# Patient Record
Sex: Female | Born: 1960 | Race: White | Hispanic: No | Marital: Married | Smoking: Never smoker
Health system: Southern US, Community
[De-identification: ages and names within clinical notes are randomized; demographics above are authoritative.]

## PROBLEM LIST (undated history)

## (undated) DIAGNOSIS — K219 Gastro-esophageal reflux disease without esophagitis: Secondary | ICD-10-CM

## (undated) DIAGNOSIS — F329 Major depressive disorder, single episode, unspecified: Secondary | ICD-10-CM

## (undated) DIAGNOSIS — F32A Depression, unspecified: Secondary | ICD-10-CM

## (undated) HISTORY — PX: CARPAL TUNNEL RELEASE: SHX101

---

## 2005-02-24 ENCOUNTER — Emergency Department (HOSPITAL_COMMUNITY): Admission: EM | Admit: 2005-02-24 | Discharge: 2005-02-24 | Payer: Self-pay | Admitting: Family Medicine

## 2011-07-24 ENCOUNTER — Emergency Department (HOSPITAL_COMMUNITY)
Admission: EM | Admit: 2011-07-24 | Discharge: 2011-07-24 | Disposition: A | Discharge status: Home / Self Care | Payer: Self-pay | Source: Home / Self Care

## 2011-07-24 ENCOUNTER — Encounter (HOSPITAL_COMMUNITY): Payer: Self-pay | Admitting: Emergency Medicine

## 2011-07-24 DIAGNOSIS — J329 Chronic sinusitis, unspecified: Secondary | ICD-10-CM

## 2011-07-24 DIAGNOSIS — B9789 Other viral agents as the cause of diseases classified elsewhere: Secondary | ICD-10-CM

## 2011-07-24 HISTORY — DX: Depression, unspecified: F32.A

## 2011-07-24 HISTORY — DX: Gastro-esophageal reflux disease without esophagitis: K21.9

## 2011-07-24 HISTORY — DX: Major depressive disorder, single episode, unspecified: F32.9

## 2011-07-24 MED ORDER — AMOXICILLIN 500 MG PO CAPS: 1000.0000 mg | ORAL_CAPSULE | Freq: Three times a day (TID) | ORAL | Status: AC

## 2011-07-24 MED ORDER — HYDROCOD POLST-CHLORPHEN POLST 10-8 MG/5ML PO LQCR: 5.0000 mL | Freq: Every evening | ORAL | Status: AC | PRN

## 2011-07-24 NOTE — ED Provider Notes (Signed)
History     CSN: [id number]  Arrival date & time 07/24/11  1649   None     Chief Complaint  Patient presents with  . Sore Throat    (Consider location/radiation/quality/duration/timing/severity/associated sxs/prior treatment) HPI Comments: Patient reports sinus pressure and congestion, sore throat, ear pain, and cough x 5 days.  States the pain in her sinuses and the sore throat are the worst part but she is also having trouble sleeping at night because of the cough.  Denies fevers, difficulty swallowing or breathing, SOB.  Is taking tylenol and ibuprofen without relief.  Patient has chronic sinus issues for which she takes zyrtec-D.    Patient is a 51 y.o. female presenting with pharyngitis. The history is provided by the patient.  Sore Throat Associated symptoms include headaches. Pertinent negatives include no chest pain and no shortness of breath.    Past Medical History  Diagnosis Date  . Asthma   . GERD (gastroesophageal reflux disease)   . Depression     Past Surgical History  Procedure Date  . Carpal tunnel release     History reviewed. No pertinent family history.  History  Substance Use Topics  . Smoking status: Never Smoker   . Smokeless tobacco: Not on file  . Alcohol Use: No    OB History    Grav Para Term Preterm Abortions TAB SAB Ect Mult Living                  Review of Systems  Constitutional: Negative for fever.  Respiratory: Negative for shortness of breath and wheezing.   Cardiovascular: Negative for chest pain.  Neurological: Positive for headaches.  All other systems reviewed and are negative.    Allergies  Codeine sulfate  Home Medications   Current Outpatient Rx  Name Route Sig Dispense Refill  . ACETAMINOPHEN 325 MG PO TABS Oral Take 650 mg by mouth every 6 (six) hours as needed.    Marland Kitchen ZYRTEC-D PO Oral Take by mouth.    . IBUPROFEN 200 MG PO TABS Oral Take 200 mg by mouth every 6 (six) hours as needed.    Marland Kitchen DEPO-PROVERA IM  Intramuscular Inject into the muscle.    Marland Kitchen OMEPRAZOLE 20 MG PO CPDR Oral Take 20 mg by mouth daily.    Marland Kitchen ZOLOFT PO Oral Take by mouth.      BP 130/91  Pulse 76  Temp(Src) 98.7 F (37.1 C) (Oral)  Resp 20  SpO2 98%  Physical Exam  Constitutional: She is oriented to person, place, and time. She appears well-developed and well-nourished.  HENT:  Head: Normocephalic and atraumatic.  Right Ear: A middle ear effusion is present.  Left Ear: A middle ear effusion is present.  Nose: Mucosal edema and rhinorrhea present. Right sinus exhibits maxillary sinus tenderness. Left sinus exhibits maxillary sinus tenderness.  Mouth/Throat: Uvula is midline. Posterior oropharyngeal erythema present. No oropharyngeal exudate, posterior oropharyngeal edema or tonsillar abscesses.  Neck: Normal range of motion. Neck supple. No tracheal deviation present.  Cardiovascular: Normal rate, regular rhythm and normal heart sounds.   Pulmonary/Chest: Effort normal and breath sounds normal. No stridor. No respiratory distress. She has no wheezes. She has no rales. She exhibits no tenderness.  Neurological: She is alert and oriented to person, place, and time.    ED Course  Procedures (including critical care time)  Labs Reviewed - No data to display No results found.   1. Viral respiratory illness   2. Sinusitis  Patient requests Tussionex by name - states she does not have allergy to this.    MDM  Nontoxic patient with 5 days of upper respiratory symptoms and sinus pain and pressure.  Pt is tender over her maxillary sinuses and has history of sinus problems, sinus infections.  D/C home with amoxicillin for sinusitis, symptomatic treatment for URI.  Patient is on decongestants at home, I have advised adding nasal spray temporarily such as Afrin.  Pt verbalizes understanding, agrees with plan.          Dillard Cannon [place], [place] 07/24/11 2129

## 2011-07-24 NOTE — ED Notes (Signed)
Onset 5 days ago of sinus congestion, sore throat.  C/o bilateral ear pain, reports body temp higher than usual.  Patient reports she thought she was getting better, but relapsed yesterday.  C/o headache and some cough, deep, dry , hacking cough

## 2011-07-25 NOTE — ED Provider Notes (Signed)
Medical screening examination/treatment/procedure(s) were performed by non-physician practitioner and as supervising physician I was immediately available for consultation/collaboration.   [facility]; MD   Sharin Grave, MD 07/25/11 [phone]

## 2015-04-24 ENCOUNTER — Emergency Department (HOSPITAL_COMMUNITY): Payer: PRIVATE HEALTH INSURANCE

## 2015-04-24 ENCOUNTER — Emergency Department (HOSPITAL_COMMUNITY)
Admission: EM | Admit: 2015-04-24 | Discharge: 2015-04-24 | Disposition: A | Discharge status: Home / Self Care | Payer: PRIVATE HEALTH INSURANCE | Attending: Emergency Medicine | Admitting: Emergency Medicine

## 2015-04-24 ENCOUNTER — Encounter (HOSPITAL_COMMUNITY): Payer: Self-pay | Admitting: Nurse Practitioner

## 2015-04-24 DIAGNOSIS — J45909 Unspecified asthma, uncomplicated: Secondary | ICD-10-CM | POA: Diagnosis not present

## 2015-04-24 DIAGNOSIS — S199XXA Unspecified injury of neck, initial encounter: Secondary | ICD-10-CM | POA: Diagnosis not present

## 2015-04-24 DIAGNOSIS — K219 Gastro-esophageal reflux disease without esophagitis: Secondary | ICD-10-CM | POA: Insufficient documentation

## 2015-04-24 DIAGNOSIS — W5582XA Struck by other mammals, initial encounter: Secondary | ICD-10-CM | POA: Diagnosis not present

## 2015-04-24 DIAGNOSIS — Y9389 Activity, other specified: Secondary | ICD-10-CM | POA: Insufficient documentation

## 2015-04-24 DIAGNOSIS — S5011XA Contusion of right forearm, initial encounter: Secondary | ICD-10-CM | POA: Insufficient documentation

## 2015-04-24 DIAGNOSIS — Y998 Other external cause status: Secondary | ICD-10-CM | POA: Insufficient documentation

## 2015-04-24 DIAGNOSIS — F329 Major depressive disorder, single episode, unspecified: Secondary | ICD-10-CM | POA: Insufficient documentation

## 2015-04-24 DIAGNOSIS — S6991XA Unspecified injury of right wrist, hand and finger(s), initial encounter: Secondary | ICD-10-CM | POA: Diagnosis present

## 2015-04-24 DIAGNOSIS — Y92810 Car as the place of occurrence of the external cause: Secondary | ICD-10-CM | POA: Insufficient documentation

## 2015-04-24 DIAGNOSIS — M25511 Pain in right shoulder: Secondary | ICD-10-CM

## 2015-04-24 DIAGNOSIS — Z79899 Other long term (current) drug therapy: Secondary | ICD-10-CM | POA: Insufficient documentation

## 2015-04-24 MED ORDER — HYDROCODONE-ACETAMINOPHEN 5-325 MG PO TABS: 1.0000 | ORAL_TABLET | Freq: Four times a day (QID) | ORAL | Status: AC | PRN
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
## 2015-04-24 NOTE — Discharge Instructions (Signed)
Contusion A contusion is a deep bruise. Contusions happen when an injury causes bleeding under the skin. Symptoms of bruising include pain, swelling, and discolored skin. The skin may turn blue, purple, or yellow. HOME CARE   Rest the injured area.  If told, put ice on the injured area.  Put ice in a plastic bag.  Place a towel between your skin and the bag.  Leave the ice on for 20 minutes, 2-3 times per day.  If told, put light pressure (compression) on the injured area using an elastic bandage. Make sure the bandage is not too tight. Remove it and put it back on as told by your doctor.  If possible, raise (elevate) the injured area above the level of your heart while you are sitting or lying down.  Take over-the-counter and prescription medicines only as told by your doctor. GET HELP IF:  Your symptoms do not get better after several days of treatment.  Your symptoms get worse.  You have trouble moving the injured area. GET HELP RIGHT AWAY IF:   You have very bad pain.  You have a loss of feeling (numbness) in a hand or foot.  Your hand or foot turns pale or cold.   This information is not intended to replace advice given to you by your health care provider. Make sure you discuss any questions you have with your health care provider.   Document Released: 11/28/2007 Document Revised: 03/02/2015 Document Reviewed: 10/27/2014 Elsevier Interactive Patient Education 2016 [employer]  Cryotherapy Cryotherapy is when you put ice on your injury. Ice helps lessen pain and puffiness (swelling) after an injury. Ice works the best when you start using it in the first 24 to 48 hours after an injury. HOME CARE  Put a dry or damp towel between the ice pack and your skin.  You may press gently on the ice pack.  Leave the ice on for no more than 10 to 20 minutes at a time.  Check your skin after 5 minutes to make sure your skin is okay.  Rest at least 20 minutes between ice  pack uses.  Stop using ice when your skin loses feeling (numbness).  Do not use ice on someone who cannot tell you when it hurts. This includes small children and people with memory problems (dementia). GET HELP RIGHT AWAY IF:  You have white spots on your skin.  Your skin turns blue or pale.  Your skin feels waxy or hard.  Your puffiness gets worse. MAKE SURE YOU:   Understand these instructions.  Will watch your condition.  Will get help right away if you are not doing well or get worse.   This information is not intended to replace advice given to you by your health care provider. Make sure you discuss any questions you have with your health care provider.   Document Released: 11/28/2007 Document Revised: 09/03/2011 Document Reviewed: 02/01/2011 Elsevier Interactive Patient Education 2016 [employer] X-rays of your clavicle hand and forearm are all negative and given a prescription for Vicodin and continues for severe pain otherwise he can take Tylenol or ibuprofen
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
## 2016-01-04 ENCOUNTER — Other Ambulatory Visit (HOSPITAL_COMMUNITY): Payer: Self-pay | Admitting: Preventative Medicine

## 2016-01-04 DIAGNOSIS — R9389 Abnormal findings on diagnostic imaging of other specified body structures: Secondary | ICD-10-CM

## 2016-01-09 ENCOUNTER — Ambulatory Visit (HOSPITAL_COMMUNITY)
Admission: RE | Admit: 2016-01-09 | Discharge: 2016-01-09 | Disposition: A | Discharge status: Home / Self Care | Payer: PRIVATE HEALTH INSURANCE | Source: Ambulatory Visit | Attending: Preventative Medicine | Admitting: Preventative Medicine

## 2016-01-09 DIAGNOSIS — R9389 Abnormal findings on diagnostic imaging of other specified body structures: Secondary | ICD-10-CM

## 2016-01-09 DIAGNOSIS — R938 Abnormal findings on diagnostic imaging of other specified body structures: Secondary | ICD-10-CM | POA: Insufficient documentation

## 2016-01-09 DIAGNOSIS — K449 Diaphragmatic hernia without obstruction or gangrene: Secondary | ICD-10-CM | POA: Diagnosis not present

## 2016-01-09 MED ORDER — IOPAMIDOL (ISOVUE-300) INJECTION 61%
75.0000 mL | Freq: Once | INTRAVENOUS | Status: AC | PRN
  Administered 2016-01-09: 75 mL via INTRAVENOUS

## 2016-01-11 ENCOUNTER — Other Ambulatory Visit (HOSPITAL_COMMUNITY): Payer: Self-pay | Admitting: Preventative Medicine

## 2016-01-11 DIAGNOSIS — R9389 Abnormal findings on diagnostic imaging of other specified body structures: Secondary | ICD-10-CM

## 2016-02-08 ENCOUNTER — Ambulatory Visit (HOSPITAL_COMMUNITY)
Admission: RE | Admit: 2016-02-08 | Discharge: 2016-02-08 | Disposition: A | Discharge status: Home / Self Care | Payer: PRIVATE HEALTH INSURANCE | Source: Ambulatory Visit | Attending: Preventative Medicine | Admitting: Preventative Medicine

## 2016-02-08 DIAGNOSIS — K760 Fatty (change of) liver, not elsewhere classified: Secondary | ICD-10-CM | POA: Insufficient documentation

## 2016-02-08 DIAGNOSIS — R938 Abnormal findings on diagnostic imaging of other specified body structures: Secondary | ICD-10-CM | POA: Insufficient documentation

## 2016-02-08 DIAGNOSIS — I2510 Atherosclerotic heart disease of native coronary artery without angina pectoris: Secondary | ICD-10-CM | POA: Diagnosis not present

## 2016-02-08 DIAGNOSIS — R9389 Abnormal findings on diagnostic imaging of other specified body structures: Secondary | ICD-10-CM

## 2016-02-08 DIAGNOSIS — K449 Diaphragmatic hernia without obstruction or gangrene: Secondary | ICD-10-CM | POA: Diagnosis not present

## 2016-05-24 ENCOUNTER — Other Ambulatory Visit (HOSPITAL_COMMUNITY): Payer: Self-pay | Admitting: Internal Medicine

## 2017-10-06 IMAGING — CT CT CHEST W/ CM
2 of 4 series · 16 of 46 positions shown, 18 images · IV contrast (iopamidol)
Comparison: Chest x-ray 01/02/2016

CLINICAL DATA: Left hilar prominence on recent chest x-ray which
was done due to recent history of acute onset cough. Evaluate for
left hilar mass or adenopathy.

EXAM:
CT CHEST WITH CONTRAST
TECHNIQUE: Multidetector CT imaging of the chest was performed during
intravenous contrast administration.
CONTRAST:  75mL A7110K-ABB IOPAMIDOL (A7110K-ABB) INJECTION 61%

[Series 2: routine chest w without · axial · non-contrast · 0.77mm/px · z∈[+970,+1246]mm · 13 of 160 slices shown, 15 images]
[im 11/160  soft-tissue]
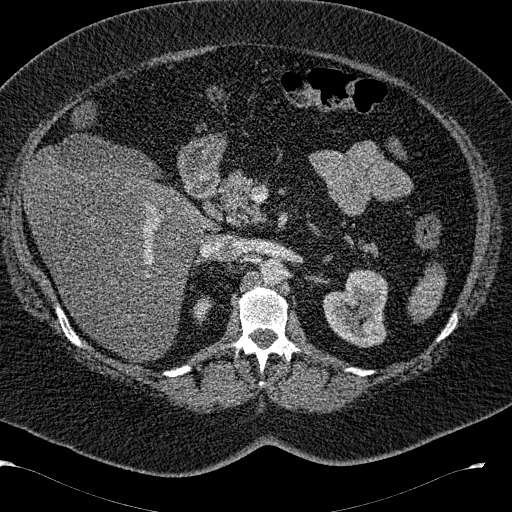
[im 11/160  bone]
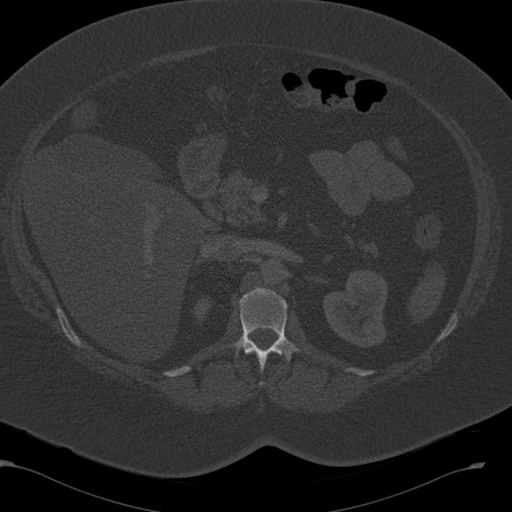
[im 22/160  soft-tissue]
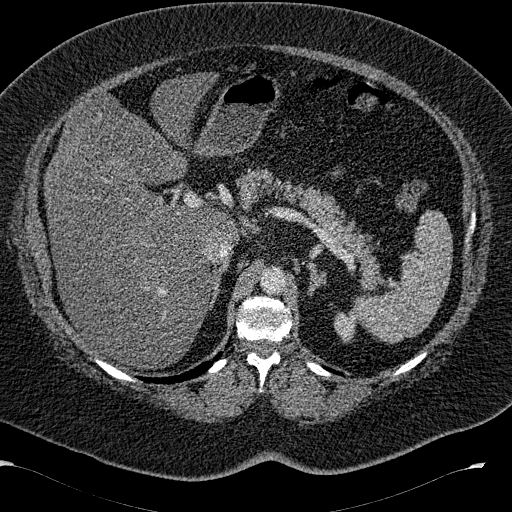
[im 32/160  soft-tissue]
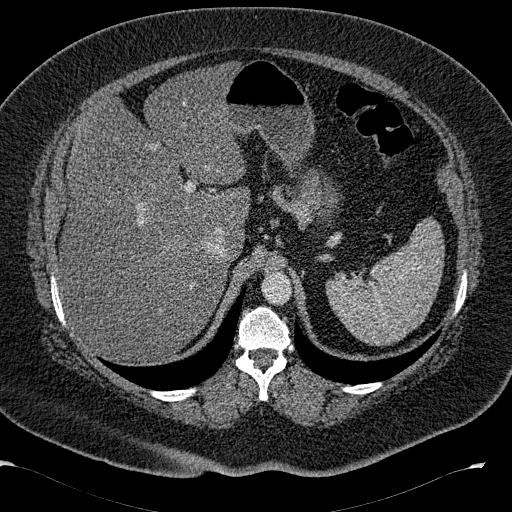
[im 43/160  soft-tissue]
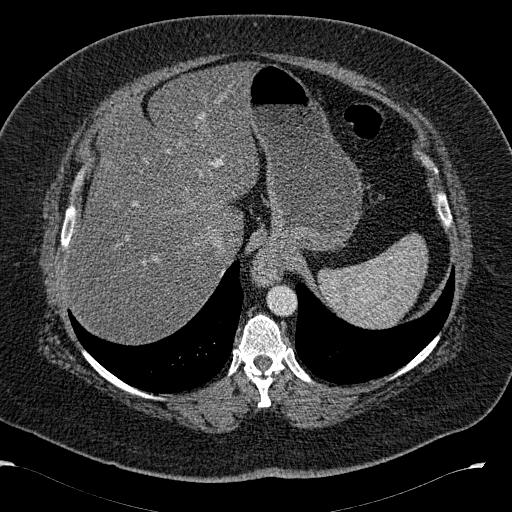
[im 54/160  soft-tissue]
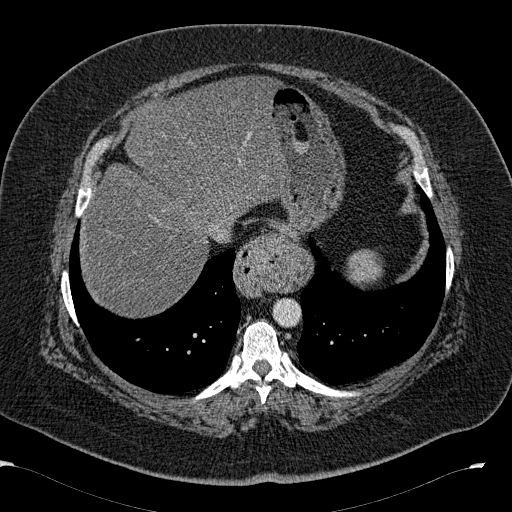
[im 64/160  soft-tissue]
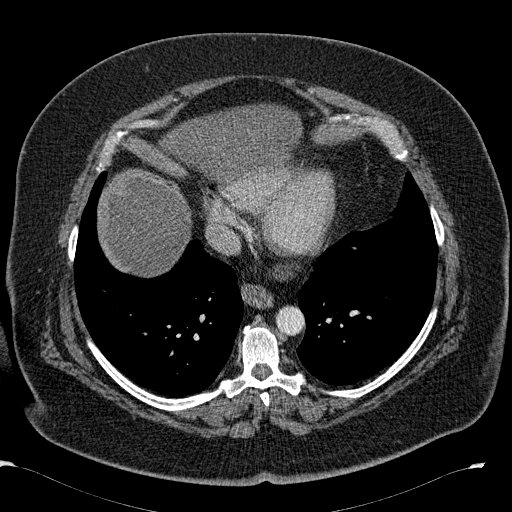
[im 85/160  soft-tissue]
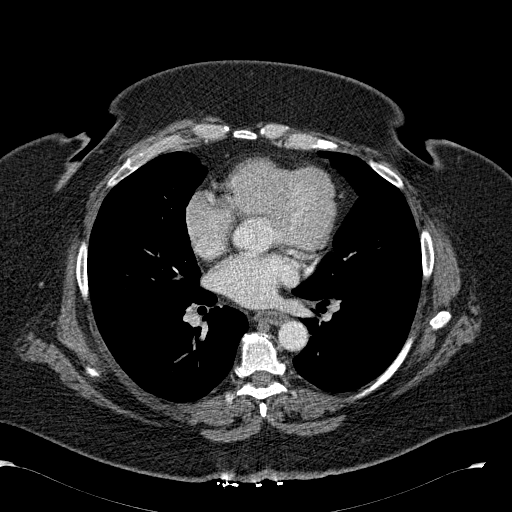
[im 96/160  soft-tissue]
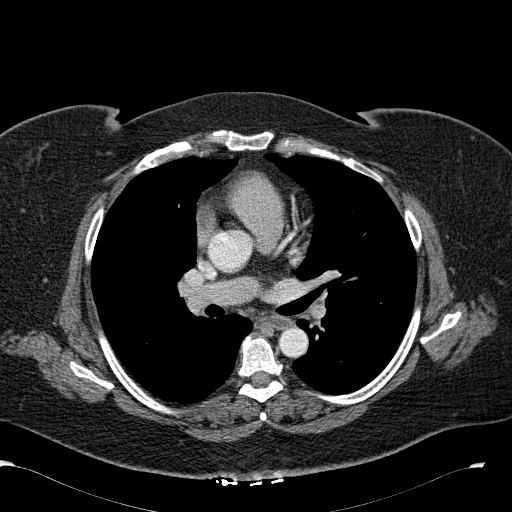
[im 107/160  soft-tissue]
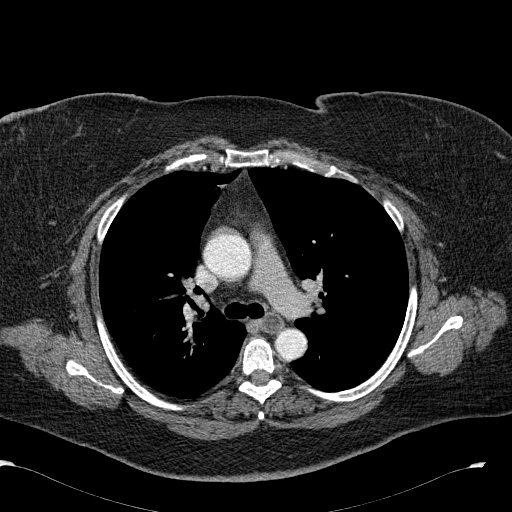
[im 107/160  bone]
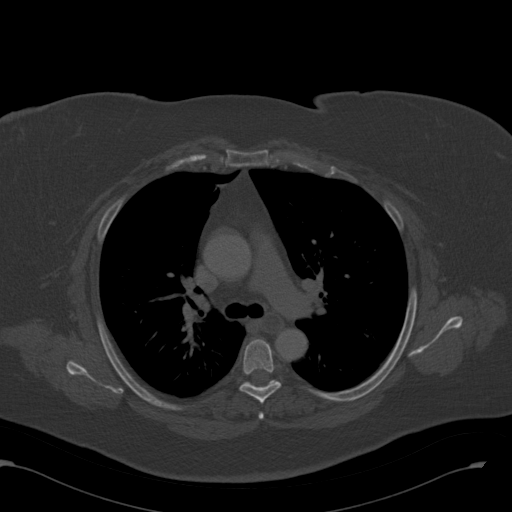
[im 117/160  soft-tissue]
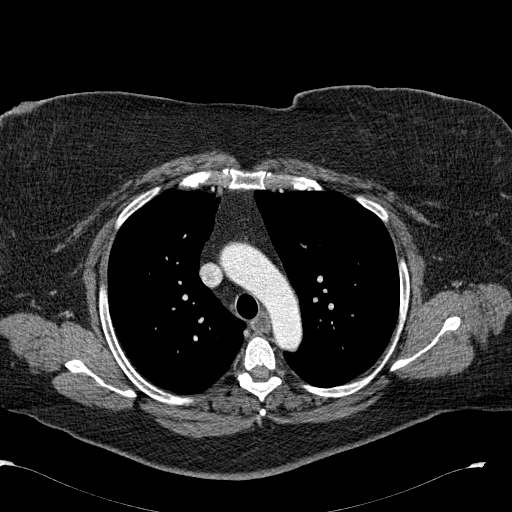
[im 128/160  soft-tissue]
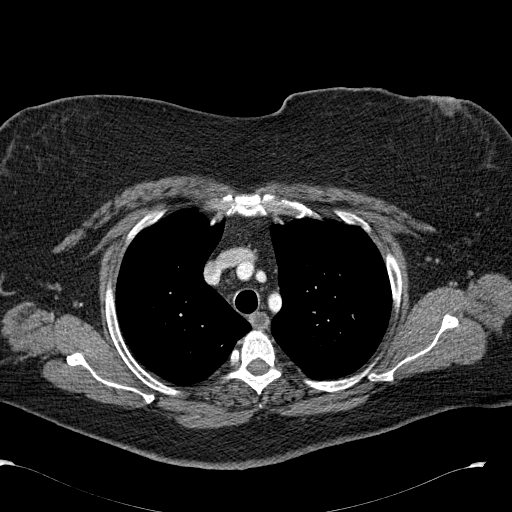
[im 138/160  soft-tissue]
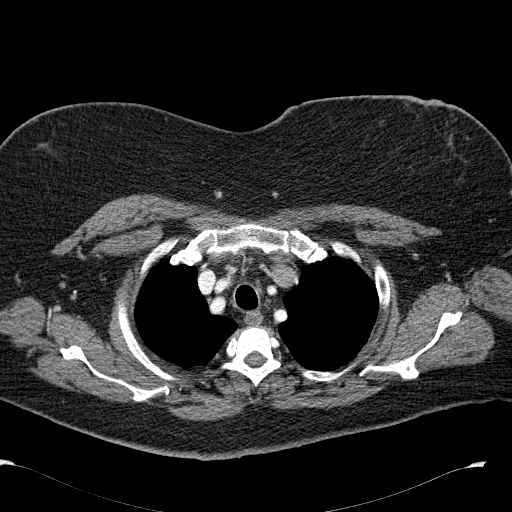
[im 149/160  soft-tissue]
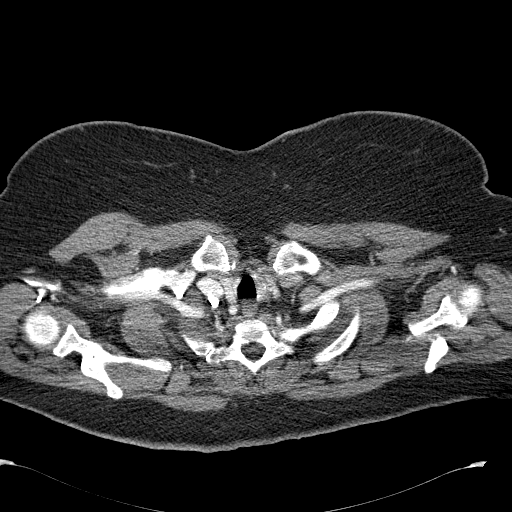

[Series 4: coronal cor · coronal · 0.62mm/px · 3 of 192 slices shown]
[im 64/192  soft-tissue]
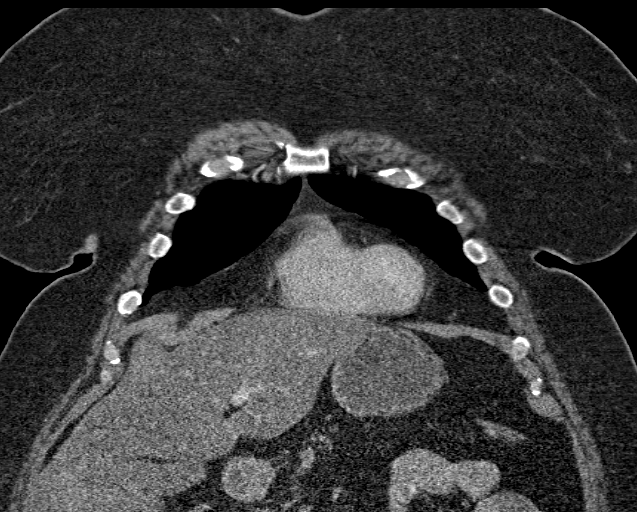
[im 85/192  soft-tissue]
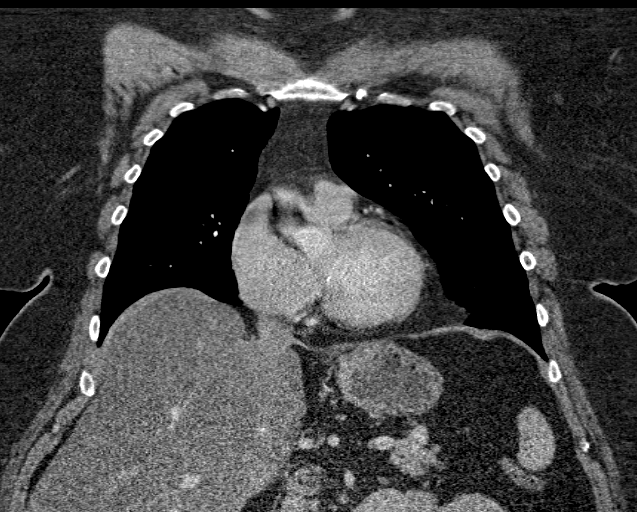
[im 107/192  soft-tissue]
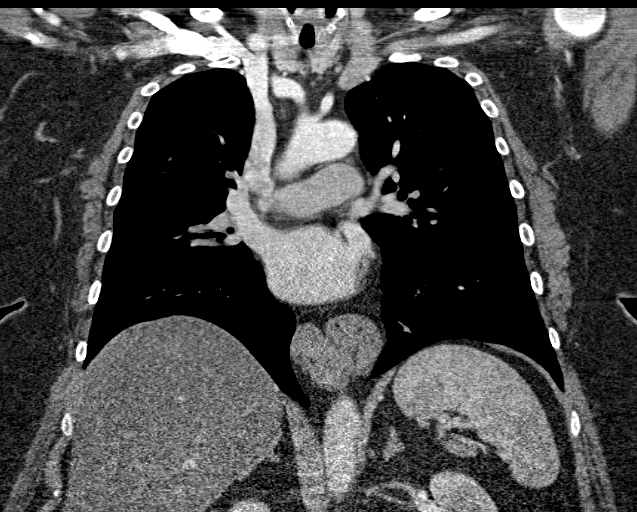

[16 of 46 positions shown; findings below may reference images not displayed]

FINDINGS: Mediastinum/Lymph Nodes: Heart is normal size. Pulmonary arteries
and thoracic aorta are within normal. No evidence of mediastinal or
hilar adenopathy. Specifically, there is no evidence of left hilar
mass or adenopathy. Moderate size hiatal hernia. Fluid within the
mid to distal esophagus likely due to dysmotility versus reflux.

Lungs/Pleura: Lungs are well inflated with patchy nodular airspace
opacification over the left lower lobe. Minimal linear atelectasis/
scarring over the lingula. No evidence of pleural effusion. Right
lung is clear. Airways are normal. Pleura is within normal.

Upper abdomen: Diffuse low-attenuation of the liver.

Musculoskeletal: Minimal degenerate change of the spine.
IMPRESSION: No evidence of left hilar mass or adenopathy.

Nodular airspace process over the left lower lobe likely due to
infection versus an inflammatory process. Recommend followup chest
CT in 4 weeks post treatment to ensure resolution.

Moderate size hiatal hernia. Moderate fluid within the mid to distal
esophagus likely due to dysmotility versus reflux.
